# Patient Record
Sex: Female | Born: 1955 | Hispanic: Yes | Marital: Single | Smoking: Never smoker
Health system: Southern US, Community
[De-identification: ages and names within clinical notes are randomized; demographics above are authoritative.]

## PROBLEM LIST (undated history)

## (undated) DIAGNOSIS — G8929 Other chronic pain: Secondary | ICD-10-CM

---

## 2019-06-20 ENCOUNTER — Emergency Department
Admission: EM | Admit: 2019-06-20 | Discharge: 2019-06-20 | Disposition: A | Discharge status: Home / Self Care | Payer: Self-pay | Attending: Emergency Medicine | Admitting: Emergency Medicine

## 2019-06-20 ENCOUNTER — Emergency Department: Payer: Self-pay

## 2019-06-20 ENCOUNTER — Other Ambulatory Visit: Payer: Self-pay

## 2019-06-20 ENCOUNTER — Encounter: Payer: Self-pay | Admitting: Intensive Care

## 2019-06-20 DIAGNOSIS — G8929 Other chronic pain: Secondary | ICD-10-CM | POA: Insufficient documentation

## 2019-06-20 DIAGNOSIS — R519 Headache, unspecified: Secondary | ICD-10-CM | POA: Insufficient documentation

## 2019-06-20 HISTORY — DX: Other chronic pain: G89.29

## 2019-06-20 LAB — CBC WITH DIFFERENTIAL/PLATELET
Abs Immature Granulocytes: 0.01 10*3/uL (ref 0.00–0.07)
Basophils Absolute: 0 10*3/uL (ref 0.0–0.1)
Basophils Relative: 1 %
Eosinophils Absolute: 0.1 10*3/uL (ref 0.0–0.5)
Eosinophils Relative: 1 %
HCT: 37.8 % (ref 36.0–46.0)
Hemoglobin: 13.2 g/dL (ref 12.0–15.0)
Immature Granulocytes: 0 %
Lymphocytes Relative: 55 %
Lymphs Abs: 2.3 10*3/uL (ref 0.7–4.0)
MCH: 31.4 pg (ref 26.0–34.0)
MCHC: 34.9 g/dL (ref 30.0–36.0)
MCV: 90 fL (ref 80.0–100.0)
Monocytes Absolute: 0.4 10*3/uL (ref 0.1–1.0)
Monocytes Relative: 10 %
Neutro Abs: 1.4 10*3/uL — ABNORMAL LOW (ref 1.7–7.7)
Neutrophils Relative %: 33 %
Platelets: 245 10*3/uL (ref 150–400)
RBC: 4.2 MIL/uL (ref 3.87–5.11)
RDW: 11.8 % (ref 11.5–15.5)
WBC: 4.2 10*3/uL (ref 4.0–10.5)
nRBC: 0 % (ref 0.0–0.2)

## 2019-06-20 LAB — COMPREHENSIVE METABOLIC PANEL
ALT: 27 U/L (ref 0–44)
AST: 27 U/L (ref 15–41)
Albumin: 4.5 g/dL (ref 3.5–5.0)
Alkaline Phosphatase: 98 U/L (ref 38–126)
Anion gap: 9 (ref 5–15)
BUN: 11 mg/dL (ref 8–23)
CO2: 25 mmol/L (ref 22–32)
Calcium: 9.4 mg/dL (ref 8.9–10.3)
Chloride: 104 mmol/L (ref 98–111)
Creatinine, Ser: 0.44 mg/dL (ref 0.44–1.00)
GFR calc Af Amer: >60 mL/min (ref >60)
GFR calc non Af Amer: >60 mL/min (ref >60)
Glucose, Bld: 122 mg/dL — ABNORMAL HIGH (ref 70–99)
Potassium: 3.6 mmol/L (ref 3.5–5.1)
Sodium: 138 mmol/L (ref 135–145)
Total Bilirubin: 0.6 mg/dL (ref 0.3–1.2)
Total Protein: 7.8 g/dL (ref 6.5–8.1)

## 2019-06-20 MED ORDER — METOCLOPRAMIDE HCL 5 MG PO TABS: 5.0000 mg | ORAL_TABLET | Freq: Three times a day (TID) | ORAL | 0 refills | Status: AC | PRN

## 2019-06-20 MED ORDER — METOCLOPRAMIDE HCL 10 MG PO TABS
10.0000 mg | ORAL_TABLET | Freq: Once | ORAL | Status: AC
  Administered 2019-06-20: 10 mg via ORAL
  Filled 2019-06-20: qty 1

## 2019-06-20 MED ORDER — ASPIRIN-ACETAMINOPHEN-CAFFEINE 500-325-65 MG PO PACK: 2.0000 | PACK | Freq: Once | ORAL | 0 refills | Status: AC | PRN

## 2019-06-20 MED ORDER — KETOROLAC TROMETHAMINE 30 MG/ML IJ SOLN
30.0000 mg | Freq: Once | INTRAMUSCULAR | Status: AC
  Administered 2019-06-20: 30 mg via INTRAMUSCULAR
  Filled 2019-06-20: qty 1

## 2019-06-20 NOTE — ED Triage Notes (Addendum)
Patient c/o intermittent headache X3 months that has progressively gotten worse. HX headaches but this one is worse. Denies light or noise sensitivity. Denies injury. Takes OTC tylenol with little relief. Denies changes in vision

## 2019-06-20 NOTE — Discharge Instructions (Addendum)
You have been treated for your headache. Your blood labs and CT scan are normal. You may take the prescription meds as directed for headache pain relief. You may also take OTC Benadryl (diphenhydramine) for headache pain relief and sleep. Return to the ED as needed.   Ha recibido tratamiento para su dolor de [place]. Sus anlisis de sangre y tomografa computarizada son normales. Puede tomar los medicamentos recetados segn las indicaciones para [place] boy [place]. Tambin puede tomar Benadryl (difenhidramina) de venta libre para [place] y dormir. Regrese al servicio de urgencias segn sea necesario.

## 2019-06-20 NOTE — ED Provider Notes (Signed)
[facility] Emergency Department Provider Note ____________________________________________  Time seen: 48  I have reviewed the triage vital signs and the nursing notes.  HISTORY  Chief Complaint  Headache  History limited by Spanish language.  Interpreter Estill Bamberg B.)  Present for interview and exam.  HPI Sonya Parsons is a 63 y.o. female presents herself to the ED for evaluation of a worsening headache.  Patient describes persistent headaches for the last 3 months that is progressively gotten worse.  She gives a history of chronic headaches but describes this one is one of the worst.  She reports posterior headache pain at begins the base of the neck and crawled over top of the head.  She denies any light or noise sensitivity denies any preceding injury, accident, trauma, or fall.  She denies any vision change, nausea, vomiting, or dizziness.  She takes Tylenol over-the-counter but reports minimal benefit at this time.  She denies any other significant medical history and takes no daily medications.   She is not currently under the care of any medical provider has not been evaluated or treated for headaches in the past.  Past Medical History:  Diagnosis Date  . Chronic headaches     There are no active problems to display for this patient.   History reviewed. No pertinent surgical history.  Prior to Admission medications   Medication Sig Start Date End Date Taking? Authorizing Provider  Aspirin-Acetaminophen-Caffeine [phone] MG PACK Take 2 tablets by mouth once as needed for up to 15 doses. 06/20/19   Arman Loy, Dannielle Karvonen, PA-C  metoCLOPramide (REGLAN) 5 MG tablet Take 1 tablet (5 mg total) by mouth every 8 (eight) hours as needed for up to 5 days for nausea or vomiting. 06/20/19 06/25/19  Jaydn Fincher, Dannielle Karvonen, PA-C    Allergies Patient has no known allergies.  History reviewed. No pertinent family history.  Social History Social History    Tobacco Use  . Smoking status: Never Smoker  . Smokeless tobacco: Never Used  Substance Use Topics  . Alcohol use: Never    Frequency: Never  . Drug use: Never    Review of Systems  Constitutional: Negative for fever. Eyes: Negative for visual changes. ENT: Negative for sore throat. Cardiovascular: Negative for chest pain. Respiratory: Negative for shortness of breath. Gastrointestinal: Negative for abdominal pain, vomiting and diarrhea. Genitourinary: Negative for dysuria. Musculoskeletal: Negative for back pain. Skin: Negative for rash. Neurological: Negative for focal weakness or numbness.  Reports headache as above. ____________________________________________  PHYSICAL EXAM:  VITAL SIGNS: ED Triage Vitals  Enc Vitals Group     BP 06/20/19 1654 (!) 181/76     Pulse Rate 06/20/19 1654 60     Resp 06/20/19 1654 14     Temp 06/20/19 1654 98.2 F (36.8 C)     Temp Source 06/20/19 1654 Oral     SpO2 06/20/19 1654 96 %     Weight 06/20/19 1650 150 lb (68 kg)     Height 06/20/19 1650 5' (1.524 m)     Head Circumference --      Peak Flow --      Pain Score 06/20/19 1650 7     Pain Loc --      Pain Edu? --      Excl. in [place]? --     Constitutional: Alert and oriented. Well appearing and in no distress.  GCS = 15 Head: Normocephalic and atraumatic. Eyes: Conjunctivae are normal. PERRL. Normal extraocular movements and  fundi bilaterally. Ears: Canals clear. TMs intact bilaterally.  Nose: No congestion/rhinorrhea/epistaxis. Mouth/Throat: Mucous membranes are moist. Neck: Supple. No thyromegaly. Cardiovascular: Normal rate, regular rhythm. Normal distal pulses. Respiratory: Normal respiratory effort. No wheezes/rales/rhonchi. Musculoskeletal: Nontender with normal range of motion in all extremities.  Neurologic: Cranial nerves II through XII grossly intact.  Normal gait without ataxia. Normal speech and language. No gross focal neurologic deficits are  appreciated. Skin:  Skin is warm, dry and intact. No rash noted. Psychiatric: Mood and affect are normal. Patient exhibits appropriate insight and judgment. ____________________________________________   LABS (pertinent positives/negatives) Labs Reviewed  CBC WITH DIFFERENTIAL/PLATELET - Abnormal; Notable for the following components:      Result Value   Neutro Abs 1.4 (*)    All other components within normal limits  COMPREHENSIVE METABOLIC PANEL - Abnormal; Notable for the following components:   Glucose, Bld 122 (*)    All other components within normal limits  ____________________________________________   RADIOLOGY  CT Head w/o CM  IMPRESSION: No acute intracranial abnormality. ____________________________________________  PROCEDURES  Reglan 10 mg PO Toradol 30 mg IM Procedures ____________________________________________  INITIAL IMPRESSION / ASSESSMENT AND PLAN / ED COURSE  She with ED evaluation of persistent headaches with a worsening headache on presentation today.  Exam is overall benign return at this time.  Her CT is negative it shows no acute intracranial process.  Labs also reassuring as it showed no signs of any dehydration or acute infectious process.  Patient reports resolution of her headache pain at the time of this discharge, following IM medication and ministration.  She is agreeable to discharge with a prescription for Reglan as well as Excedrin extra strength for pain relief.  She will be referred to [facility] for ongoing symptom management care.  Return precautions have been reviewed.  Sonya Parsons was evaluated in Emergency Department on 06/20/2019 for the symptoms described in the history of present illness. She was evaluated in the context of the global COVID-19 pandemic, which necessitated consideration that the patient might be at risk for infection with the SARS-CoV-2 virus that causes COVID-19. Institutional protocols and algorithms that pertain  to the evaluation of patients at risk for COVID-19 are in a state of rapid change based on information released by regulatory bodies including the CDC and federal and state organizations. These policies and algorithms were followed during the patient's care in the ED. ____________________________________________  FINAL CLINICAL IMPRESSION(S) / ED DIAGNOSES  Final diagnoses:  Chronic nonintractable headache, unspecified headache type      Lissa Hoard, PA-C 06/20/19 2038    Dionne Bucy, MD 06/20/19 2350

## 2020-06-03 IMAGING — CT CT HEAD W/O CM
3 series · 15 of 47 positions shown, 18 images · non-contrast
Comparison: None.

CLINICAL DATA: Headache

EXAM:
CT HEAD WITHOUT CONTRAST
TECHNIQUE: Contiguous axial images were obtained from the base of the skull
through the vertex without intravenous contrast.

[Series 2: head wo · axial · 0.42mm/px · z∈[-111,+14]mm · 9 of 31 slices shown, 12 images]
[im 3/31  brain]
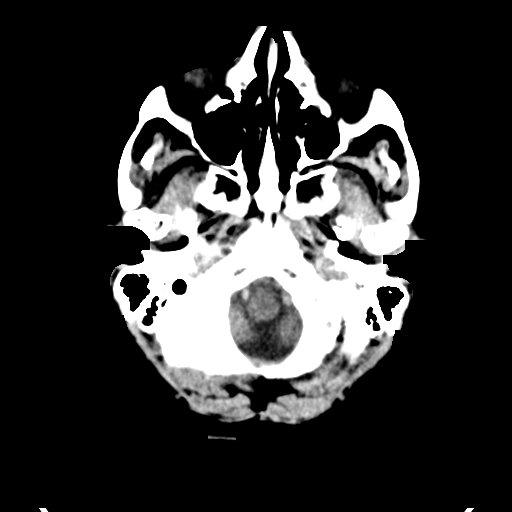
[im 3/31  bone]
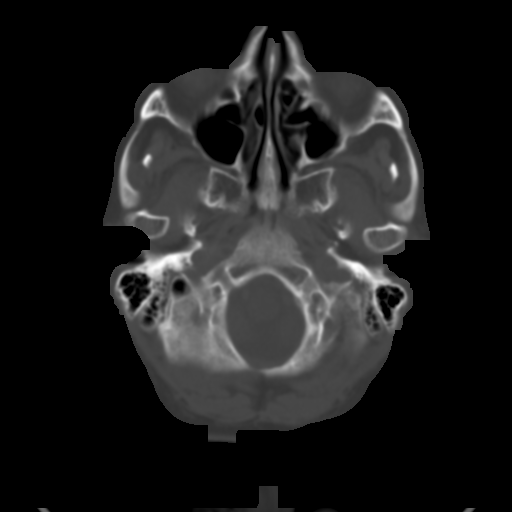
[im 6/31  brain]
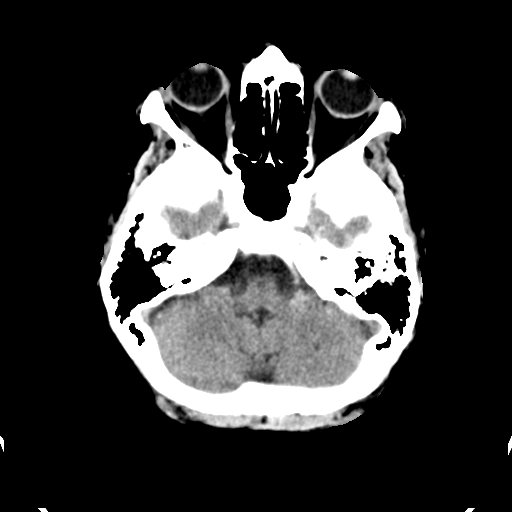
[im 9/31  brain]
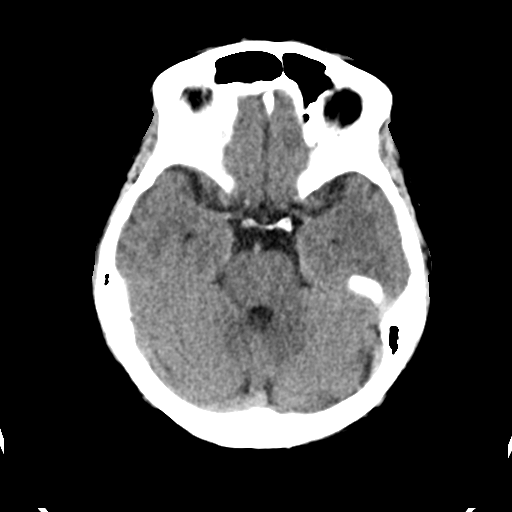
[im 12/31  brain]
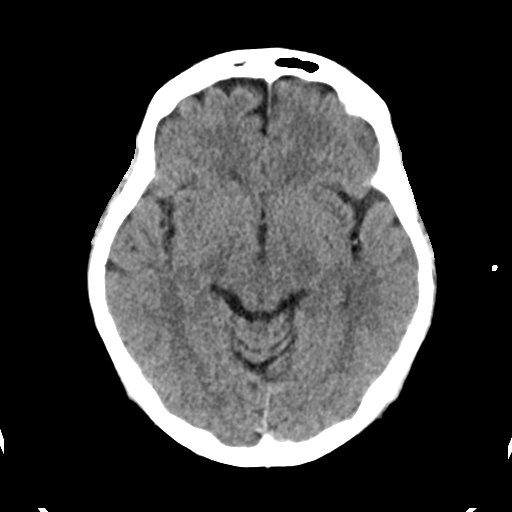
[im 16/31  brain]
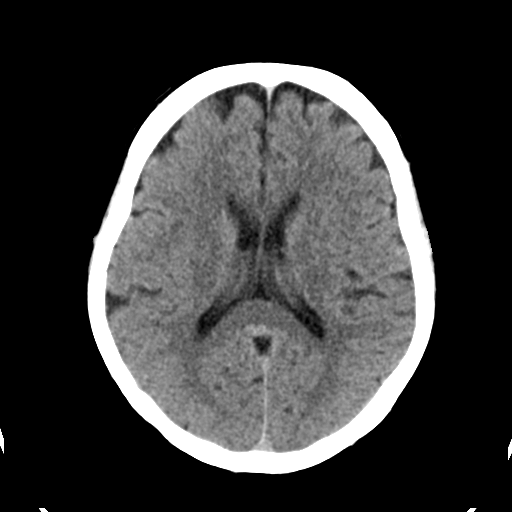
[im 16/31  bone]
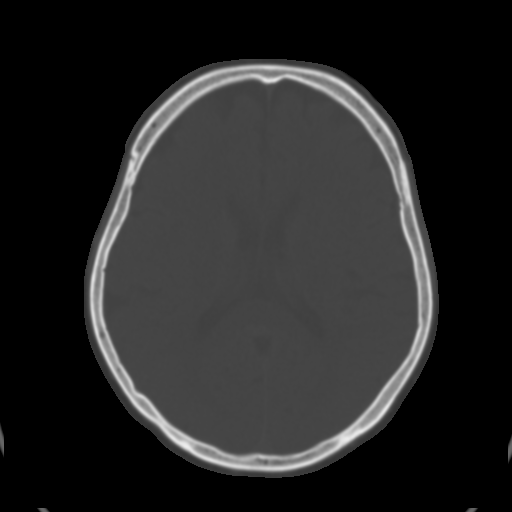
[im 19/31  brain]
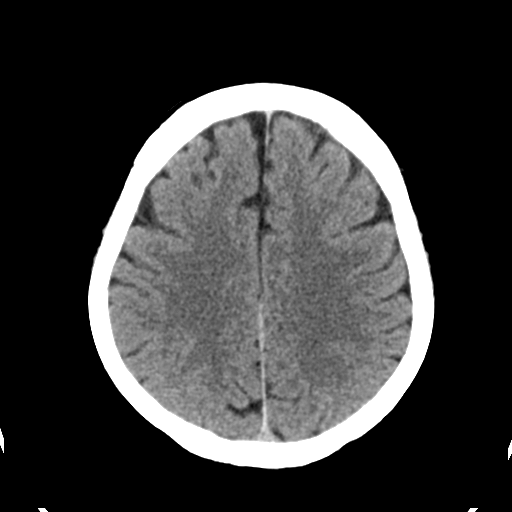
[im 22/31  brain]
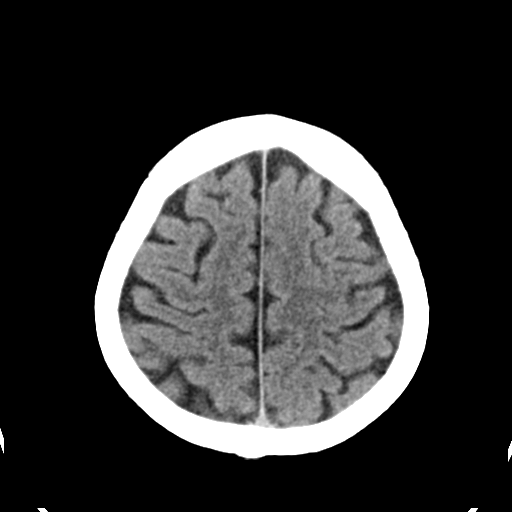
[im 25/31  brain]
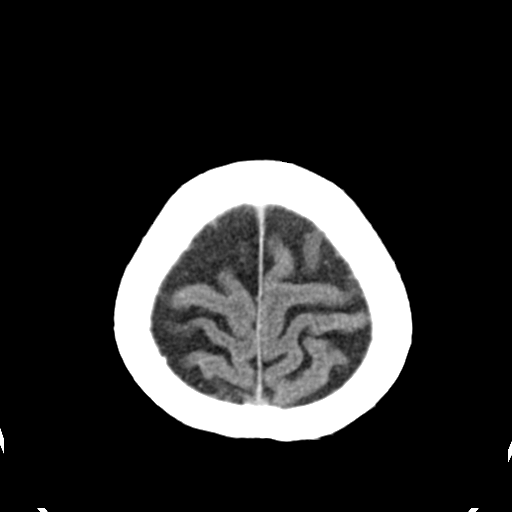
[im 28/31  brain]
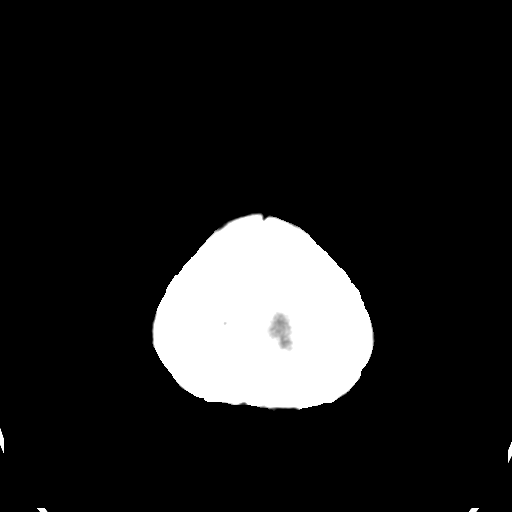
[im 28/31  bone]
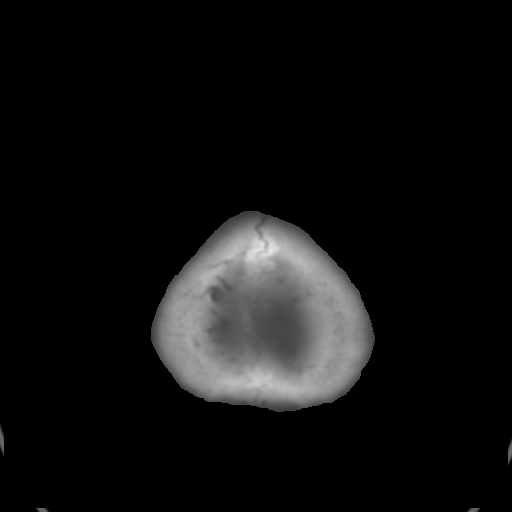

[Series 4: coronal soft tissue · coronal · 0.32mm/px · 3 of 67 slices shown]
[im 23/67  brain]
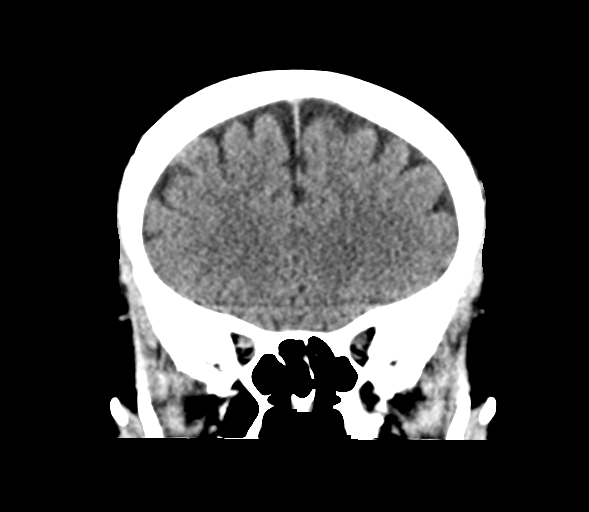
[im 30/67  brain]
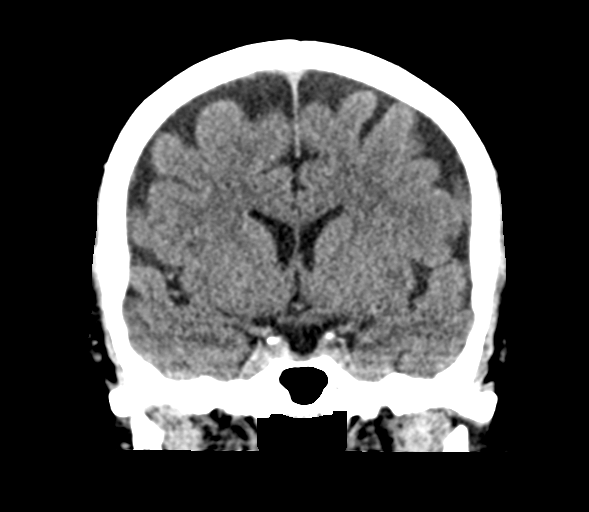
[im 37/67  brain]
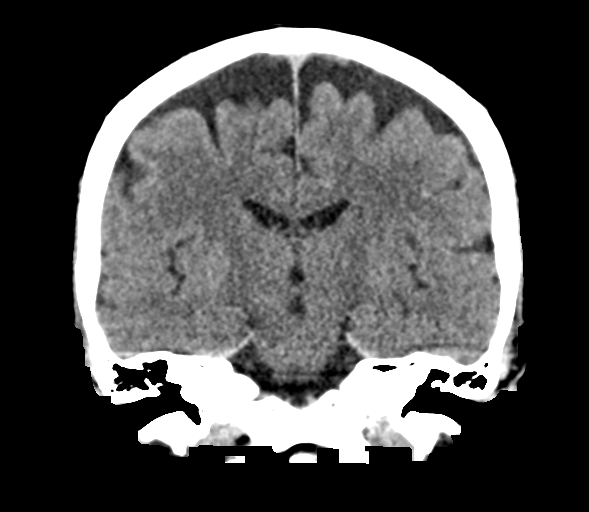

[Series 5: sagittal soft tissue · sagittal · 0.35mm/px · 3 of 53 slices shown]
[im 18/53  brain]
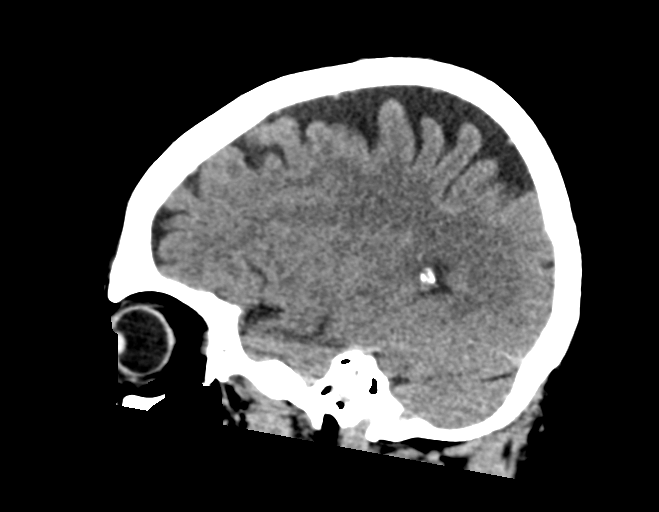
[im 27/53  brain]
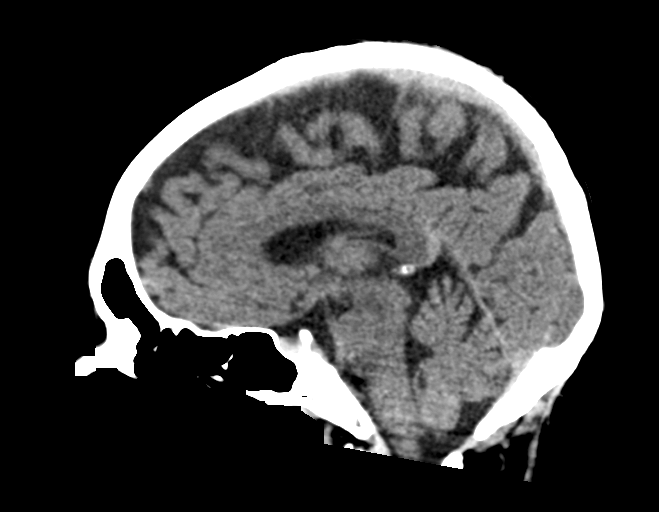
[im 35/53  brain]
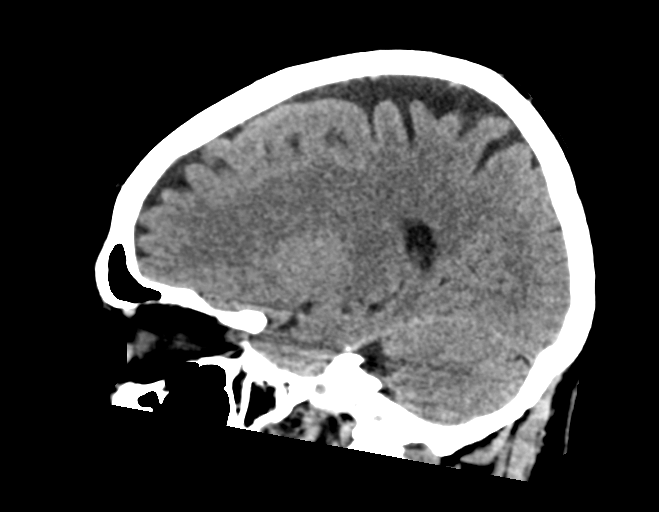

[15 of 47 positions shown; findings below may reference images not displayed]

FINDINGS: Brain: No evidence of acute territorial infarction, hemorrhage,
hydrocephalus,extra-axial collection or mass lesion/mass effect.
Normal gray-white differentiation. Ventricles are normal in size and
contour.

Vascular: No hyperdense vessel or unexpected calcification.

Skull: The skull is intact. No fracture or focal lesion identified.

Sinuses/Orbits: The visualized paranasal sinuses and mastoid air
cells are clear. The orbits and globes intact.

Other: None
IMPRESSION: No acute intracranial abnormality.
